# Patient Record
Sex: Male | Born: 1960 | Race: White | Hispanic: No | Marital: Married | Smoking: Never smoker
Health system: Southern US, Community
[De-identification: ages and names within clinical notes are randomized; demographics above are authoritative.]

---

## 1999-10-20 ENCOUNTER — Ambulatory Visit (HOSPITAL_COMMUNITY): Admission: RE | Admit: 1999-10-20 | Discharge: 1999-10-20 | Payer: Self-pay | Admitting: Orthopedic Surgery

## 1999-10-20 ENCOUNTER — Encounter: Payer: Self-pay | Admitting: Orthopedic Surgery

## 2018-01-11 ENCOUNTER — Ambulatory Visit (INDEPENDENT_AMBULATORY_CARE_PROVIDER_SITE_OTHER): Payer: BLUE CROSS/BLUE SHIELD

## 2018-01-11 ENCOUNTER — Encounter (INDEPENDENT_AMBULATORY_CARE_PROVIDER_SITE_OTHER): Payer: Self-pay | Admitting: Orthopedic Surgery

## 2018-01-11 ENCOUNTER — Ambulatory Visit (INDEPENDENT_AMBULATORY_CARE_PROVIDER_SITE_OTHER): Payer: BLUE CROSS/BLUE SHIELD | Admitting: Orthopedic Surgery

## 2018-01-11 DIAGNOSIS — G8929 Other chronic pain: Secondary | ICD-10-CM | POA: Diagnosis not present

## 2018-01-11 DIAGNOSIS — M25512 Pain in left shoulder: Secondary | ICD-10-CM | POA: Diagnosis not present

## 2018-01-11 DIAGNOSIS — M75102 Unspecified rotator cuff tear or rupture of left shoulder, not specified as traumatic: Secondary | ICD-10-CM | POA: Diagnosis not present

## 2018-01-11 NOTE — Progress Notes (Signed)
Office Visit Note   Patient: Ricardo Green           Date of Birth: 01/19/1961           MRN: [id number] Visit Date: 01/11/2018 Requested by: No referring provider defined for this encounter. PCP: Patient, No Pcp Per  Subjective: Chief Complaint  Patient presents with  . Left Shoulder - Pain    HPI: Augusto GambleJody is a patient with left shoulder pain.  Dislocated the shoulder 30 years ago.  Did well with that.  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  Has significant pain and was actually required to lift the left arm with his right arm for several weeks.  He is left-hand dominant.  Now he describes catching and locking in the left shoulder.  It will wake him from sleep at night.  The symptoms have been going on for 3 months.  Uses Tylenol and ibuprofen without much relief.  He is a [employer].              ROS: All systems reviewed are negative as they relate to the chief complaint within the history of present illness.  Patient denies  fevers or chills.   Assessment & Plan: Visit Diagnoses:  1. Chronic left shoulder pain   2. Tear of left rotator cuff, unspecified tear extent     Plan: Impression is left shoulder pain with mechanical symptoms weakness and ultrasound examination which suggest at least a partial cuff tearing.  Plan is MRI arthrogram to evaluate for rotator cuff pathology most likely in the supraspinatus region.  I will see him back after that study  Follow-Up Instructions: Return for after MRI.   Orders:  Orders Placed This Encounter  Procedures  . XR Shoulder Left  . MR Shoulder Left w/ contrast  . Arthrogram   No orders of the defined types were placed in this encounter.     Procedures: No procedures performed   Clinical Data: No additional findings.  Objective: Vital Signs: There were no vitals taken for this visit.  Physical Exam: On physical examination patient has full cervical spine range of motion.  Intact EPL FPL  interosseous function bilaterally.  Left shoulder demonstrates some coarseness with passive range of motion above 90 degrees.  He also has 5- out of 5 strength on the left compared to the right to infraspinatus and supraspinatus testing.  Subscap strength also slightly less on the left compared to the right.  Negative O'Brien's testing on the left and right.  No discrete AC joint tenderness on the left-hand side.  But no other masses lymphadenopathy or skin changes noted in the left shoulder girdle region.  Ortho Exam:   Constitutional: Patient appears well-developed HEENT:  Head: Normocephalic Eyes:EOM are normal Neck: Normal range of motion Cardiovascular: Normal rate Pulmonary/chest: Effort normal Neurologic: Patient is alert Skin: Skin is warm Psychiatric: Patient has normal mood and affect    Specialty Comments:  No specialty comments available.  Imaging: No results found.   PMFS History: There are no active problems to display for this patient.  History reviewed. No pertinent past medical history.  History reviewed. No pertinent family history.  History reviewed. No pertinent surgical history. Social History   Occupational History  . Not on file  Tobacco Use  . Smoking status: Not on file  Substance and Sexual Activity  . Alcohol use: Not on file  . Drug use: Not on file  . Sexual activity:  Not on file

## 2018-01-25 ENCOUNTER — Telehealth (INDEPENDENT_AMBULATORY_CARE_PROVIDER_SITE_OTHER): Payer: Self-pay | Admitting: Orthopedic Surgery

## 2018-01-25 NOTE — Telephone Encounter (Signed)
Patients wife left a voicemail wanting a call back from you concerning the MRI scheduled for the patient, # [phone]

## 2018-01-25 NOTE — Telephone Encounter (Signed)
IC s/w her at length. She stated that her husband is very anxious about getting scan because he has convinced himself that the contrast is going to shut his kidneys down.  She will discuss with him per our conversation and let us know how he wishes to proceed.  Patient does not have HTN nor diabetes and advised that not likely that the amount of contrast used in scan is not likely to shut his kidneys down.

## 2018-02-01 ENCOUNTER — Ambulatory Visit
Admission: RE | Admit: 2018-02-01 | Discharge: 2018-02-01 | Disposition: A | Discharge status: Home / Self Care | Payer: BLUE CROSS/BLUE SHIELD | Source: Ambulatory Visit | Attending: Orthopedic Surgery | Admitting: Orthopedic Surgery

## 2018-02-01 DIAGNOSIS — M25512 Pain in left shoulder: Principal | ICD-10-CM

## 2018-02-01 DIAGNOSIS — G8929 Other chronic pain: Secondary | ICD-10-CM

## 2018-02-01 MED ORDER — IOPAMIDOL (ISOVUE-M 200) INJECTION 41%
13.0000 mL | Freq: Once | INTRAMUSCULAR | Status: AC
  Administered 2018-02-01: 13 mL via INTRA_ARTICULAR

## 2018-02-06 ENCOUNTER — Encounter (INDEPENDENT_AMBULATORY_CARE_PROVIDER_SITE_OTHER): Payer: Self-pay | Admitting: Orthopedic Surgery

## 2018-02-06 ENCOUNTER — Ambulatory Visit (INDEPENDENT_AMBULATORY_CARE_PROVIDER_SITE_OTHER): Payer: BLUE CROSS/BLUE SHIELD | Admitting: Orthopedic Surgery

## 2018-02-06 DIAGNOSIS — M75122 Complete rotator cuff tear or rupture of left shoulder, not specified as traumatic: Secondary | ICD-10-CM | POA: Diagnosis not present

## 2018-02-06 NOTE — Progress Notes (Signed)
   Office Visit Note   Patient: Ricardo Green           Date of Birth: 10/10/1961           MRN: [id number] Visit Date: 02/06/2018 Requested by: No referring provider defined for this encounter. PCP: Patient, No Pcp Per  Subjective: Chief Complaint  Patient presents with  . Left Shoulder - Follow-up    HPI: Ricardo Green is a patient here for review of MRI scan on the left shoulder.  I reviewed the scan with the patient.  Shows a rotator cuff tear 1/2 cm in width with 1 cm of retraction.  He is left-hand dominant.  He has to throw straps over the logs.  He is a smoker.  I counseled him as to the deleterious effect of smoking on tissue healing.              ROS: All systems reviewed are negative as they relate to the chief complaint within the history of present illness.  Patient denies  fevers or chills.   Assessment & Plan: Visit Diagnoses:  1. 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  If he chooses to get this fixed which I recommended that he do that he will be out of work for about 2-1/2-3 months from throwing straps over logs.  Risk and benefits are discussed of surgical intervention.  I think within the next 4-6 months the rate of success of repair would be higher than if he waited longer.  Patient understands and may consider surgical intervention in the near future.  I will see him back as needed  Follow-Up Instructions: Return if symptoms worsen or fail to improve.   Orders:  No orders of the defined types were placed in this encounter.  No orders of the defined types were placed in this encounter.     Procedures: No procedures performed   Clinical Data: No additional findings.  Objective: Vital Signs: There were no vitals taken for this visit.  Physical Exam:   Constitutional: Patient appears well-developed HEENT:  Head: Normocephalic Eyes:EOM are  normal Neck: Normal range of motion Cardiovascular: Normal rate Pulmonary/chest: Effort normal Neurologic: Patient is alert Skin: Skin is warm Psychiatric: Patient has normal mood and affect    Ortho Exam: Orthopedic exam demonstrates full active and passive range of motion of the left hand.  Slight coarse grinding within internal and external rotation of that left arm.  No AC joint tenderness to direct palpation left versus right.  O'Brien's testing negative on the left negative on the right.  No restriction of external rotation at 15 degrees of abduction.  No other masses lymph adenopathy or skin changes noted in the shoulder girdle region.  Specialty Comments:  No specialty comments available.  Imaging: No results found.   PMFS History: There are no active problems to display for this patient.  History reviewed. No pertinent past medical history.  History reviewed. No pertinent family history.  History reviewed. No pertinent surgical history. Social History   Occupational History  . Not on file  Tobacco Use  . Smoking status: Never Smoker  . Smokeless tobacco: Never Used  Substance and Sexual Activity  . Alcohol use: Not on file  . Drug use: Not on file  . Sexual activity: Not on file

## 2019-10-08 IMAGING — XA DG FLUORO GUIDE NDL PLC/BX
2 series · 2 of 2 positions shown · IV contrast (multihance)
Comparison: none

CLINICAL DATA: Chronic left shoulder pain.

EXAM:
LEFT SHOULDER INJECTION UNDER FLUOROSCOPY
TECHNIQUE: An appropriate skin entrance site was determined. The site was
marked, prepped with Betadine, draped in the usual sterile fashion,
and infiltrated locally with 1% lidocaine. A 22 gauge spinal needle
was advanced to the superomedial margin of the humeral head under
intermittent fluoroscopy. 1 mL of 1% lidocaine injected easily. A
mixture of 0.05 mL of MultiHance, 5 mL of 1% lidocaine, and 15 mL of
Isovue-M 200 was then used to opacify the left shoulder capsule. 13
mL of this mixture were injected. No immediate complication.
FLUOROSCOPY TIME:  Fluoroscopy Time:  4 seconds
Radiation Exposure Index (if provided by the fluoroscopic device):
14.30 microGray*m^2
Number of Acquired Spot Images: 0

[Series 1: ortho standard · 1 of 1 slices shown (1 of 2)]
[im 1/1]
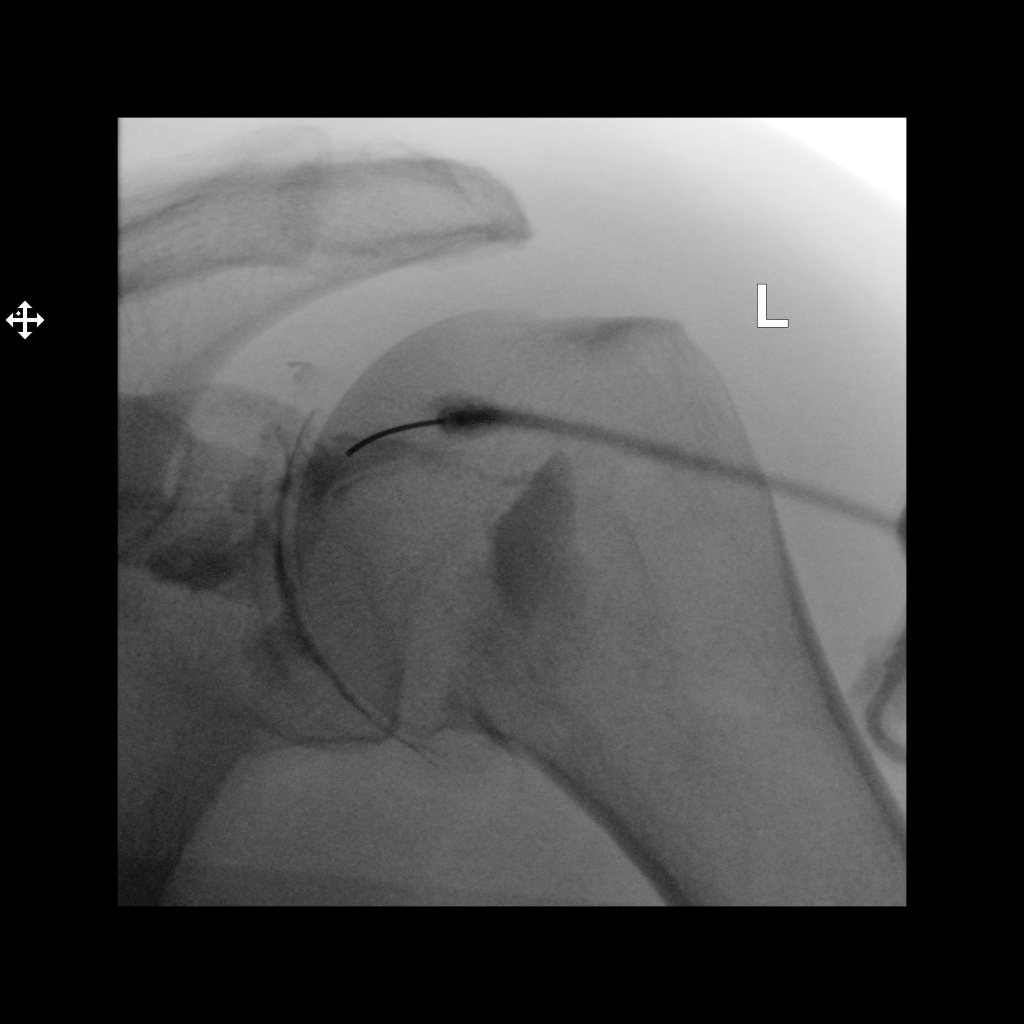

[Series 2: ortho standard · 1 of 1 slices shown (2 of 2)]
[im 1/1]
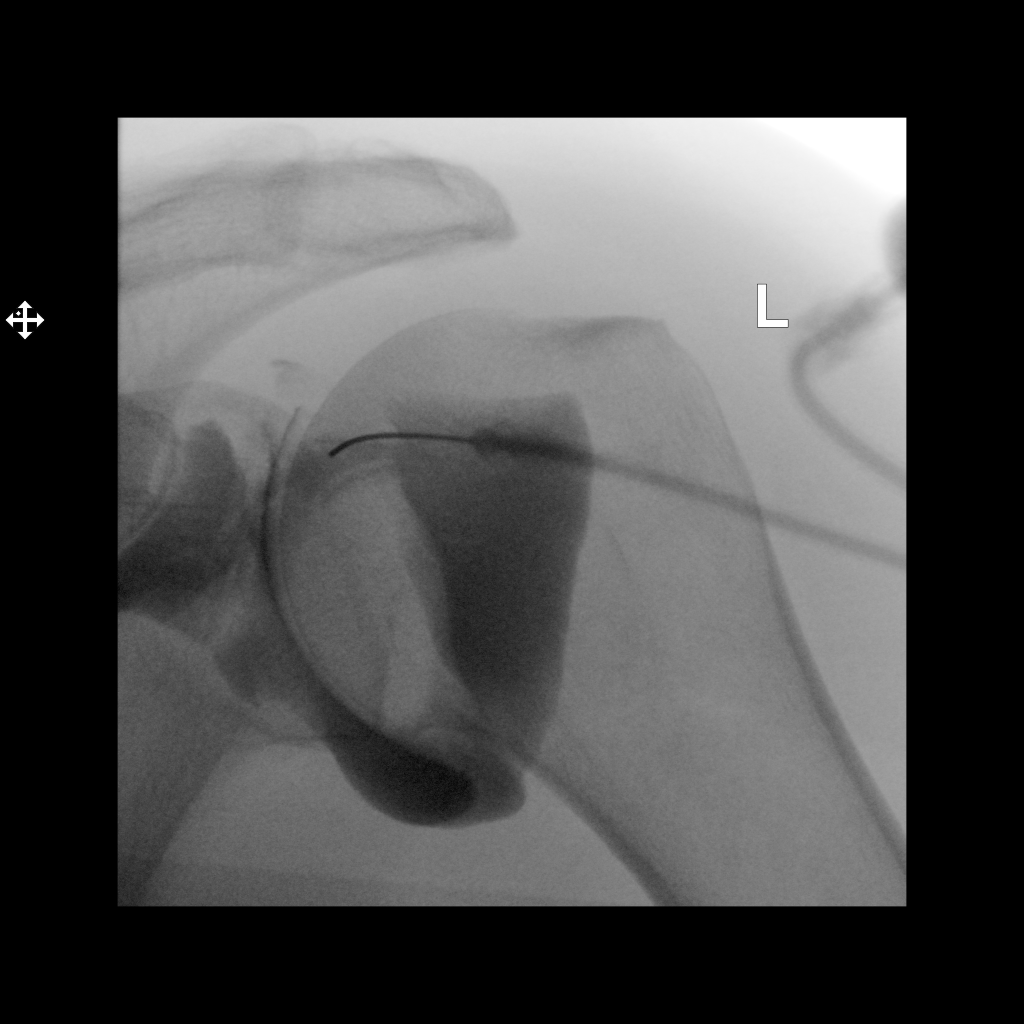

[2 of 2 positions shown; findings below may reference images not displayed]

IMPRESSION: Technically successful left shoulder injection for MRI.
